# Patient Record
Sex: Male | Born: 1990 | Race: Black or African American | Hispanic: No | Marital: Single | Smoking: Never smoker
Health system: Southern US, Community
[De-identification: ages and names within clinical notes are randomized; demographics above are authoritative.]

---

## 2015-10-03 ENCOUNTER — Encounter (HOSPITAL_COMMUNITY): Payer: Self-pay | Admitting: Emergency Medicine

## 2015-10-03 ENCOUNTER — Emergency Department (HOSPITAL_COMMUNITY)
Admission: EM | Admit: 2015-10-03 | Discharge: 2015-10-03 | Disposition: A | Discharge status: Home / Self Care | Payer: PRIVATE HEALTH INSURANCE | Attending: Emergency Medicine | Admitting: Emergency Medicine

## 2015-10-03 ENCOUNTER — Emergency Department (HOSPITAL_COMMUNITY): Payer: PRIVATE HEALTH INSURANCE

## 2015-10-03 DIAGNOSIS — Y9289 Other specified places as the place of occurrence of the external cause: Secondary | ICD-10-CM | POA: Insufficient documentation

## 2015-10-03 DIAGNOSIS — S8992XA Unspecified injury of left lower leg, initial encounter: Secondary | ICD-10-CM | POA: Diagnosis present

## 2015-10-03 DIAGNOSIS — S8002XA Contusion of left knee, initial encounter: Secondary | ICD-10-CM | POA: Diagnosis not present

## 2015-10-03 DIAGNOSIS — Y990 Civilian activity done for income or pay: Secondary | ICD-10-CM | POA: Diagnosis not present

## 2015-10-03 DIAGNOSIS — Y9389 Activity, other specified: Secondary | ICD-10-CM | POA: Insufficient documentation

## 2015-10-03 DIAGNOSIS — M25562 Pain in left knee: Secondary | ICD-10-CM
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
## 2015-10-03 NOTE — Discharge Instructions (Signed)
Use tylenol or motrin as needed for pain. Ice and elevate your knee for additional relief of pain. Follow up with your regular doctor in 1 week for recheck of ongoing symptoms. Return to the ER for changes or worsening symptoms.   Knee Pain Knee pain is a very common symptom and can have many causes. Knee pain often goes away when you follow your health care provider's instructions for relieving pain and discomfort at home. However, knee pain can develop into a condition that needs treatment. Some conditions may include:  Arthritis caused by wear and tear (osteoarthritis).  Arthritis caused by swelling and irritation (rheumatoid arthritis or gout).  A cyst or growth in your knee.  An infection in your knee joint.  An injury that will not heal.  Damage, swelling, or irritation of the tissues that support your knee (torn ligaments or tendinitis). If your knee pain continues, additional tests may be ordered to diagnose your condition. Tests may include X-rays or other imaging studies of your knee. You may also need to have fluid removed from your knee. Treatment for ongoing knee pain depends on the cause, but treatment may include:  Medicines to relieve pain or swelling.  Steroid injections in your knee.  Physical therapy.  Surgery. HOME CARE INSTRUCTIONS  Take medicines only as directed by your health care provider.  Rest your knee and keep it raised (elevated) while you are resting.  Do not do things that cause or worsen pain.  Avoid high-impact activities or exercises, such as running, jumping rope, or doing jumping jacks.  Apply ice to the knee area:  Put ice in a plastic bag.  Place a towel between your skin and the bag.  Leave the ice on for 20 minutes, 2-3 times a day.  Ask your health care provider if you should wear an elastic knee support.  Keep a pillow under your knee when you sleep.  Lose weight if you are overweight. Extra weight can put pressure on your  knee.  Do not use any tobacco products, including cigarettes, chewing tobacco, or electronic cigarettes. If you need help quitting, ask your health care provider. Smoking may slow the healing of any bone and joint problems that you may have. SEEK MEDICAL CARE IF:  Your knee pain continues, changes, or gets worse.  You have a fever along with knee pain.  Your knee buckles or locks up.  Your knee becomes more swollen. SEEK IMMEDIATE MEDICAL CARE IF:   Your knee joint feels hot to the touch.  You have chest pain or trouble breathing.   This information is not intended to replace advice given to you by your health care provider. Make sure you discuss any questions you have with your health care provider.   Document Released: 07/23/2007 Document Revised: 10/16/2014 Document Reviewed: 05/11/2014 Elsevier Interactive Patient Education 2016 [employer]  Contusion A contusion is a deep bruise. Contusions happen when an injury causes bleeding under the skin. Symptoms of bruising include pain, swelling, and discolored skin. The skin may turn blue, purple, or yellow. HOME CARE   Rest the injured area.  If told, put ice on the injured area.  Put ice in a plastic bag.  Place a towel between your skin and the bag.  Leave the ice on for 20 minutes, 2-3 times per day.  If told, put light pressure (compression) on the injured area using an elastic bandage. Make sure the bandage is not too tight. Remove it and put it back  on as told by your doctor.  If possible, raise (elevate) the injured area above the level of your heart while you are sitting or lying down.  Take over-the-counter and prescription medicines only as told by your doctor. GET HELP IF:  Your symptoms do not get better after several days of treatment.  Your symptoms get worse.  You have trouble moving the injured area. GET HELP RIGHT AWAY IF:   You have very bad pain.  You have a loss of feeling (numbness) in a hand  or foot.  Your hand or foot turns pale or cold.   This information is not intended to replace advice given to you by your health care provider. Make sure you discuss any questions you have with your health care provider.   Document Released: 03/13/2008 Document Revised: 06/16/2015 Document Reviewed: 02/10/2015 Elsevier Interactive Patient Education 2016 [employer]  Cryotherapy Cryotherapy is when you put ice on your injury. Ice helps lessen pain and puffiness (swelling) after an injury. Ice works the best when you start using it in the first 24 to 48 hours after an injury. HOME CARE  Put a dry or damp towel between the ice pack and your skin.  You may press gently on the ice pack.  Leave the ice on for no more than 10 to 20 minutes at a time.  Check your skin after 5 minutes to make sure your skin is okay.  Rest at least 20 minutes between ice pack uses.  Stop using ice when your skin loses feeling (numbness).  Do not use ice on someone who cannot tell you when it hurts. This includes small children and people with memory problems (dementia). GET HELP RIGHT AWAY IF:  You have white spots on your skin.  Your skin turns blue or pale.  Your skin feels waxy or hard.  Your puffiness gets worse. MAKE SURE YOU:   Understand these instructions.  Will watch your condition.  Will get help right away if you are not doing well or get worse.   This information is not intended to replace advice given to you by your health care provider. Make sure you discuss any questions you have with your health care provider.   Document Released: 03/13/2008 Document Revised: 12/18/2011 Document Reviewed: 05/18/2011 Elsevier Interactive Patient Education [employer]
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
## 2017-07-05 IMAGING — CR DG KNEE COMPLETE 4+V*L*
4 series · 4 of 4 positions shown · non-contrast
Comparison: None.

CLINICAL DATA: 24-year-old male with fall and left knee pain.

EXAM:
LEFT KNEE - COMPLETE 4+ VIEW

[t knee ap left]
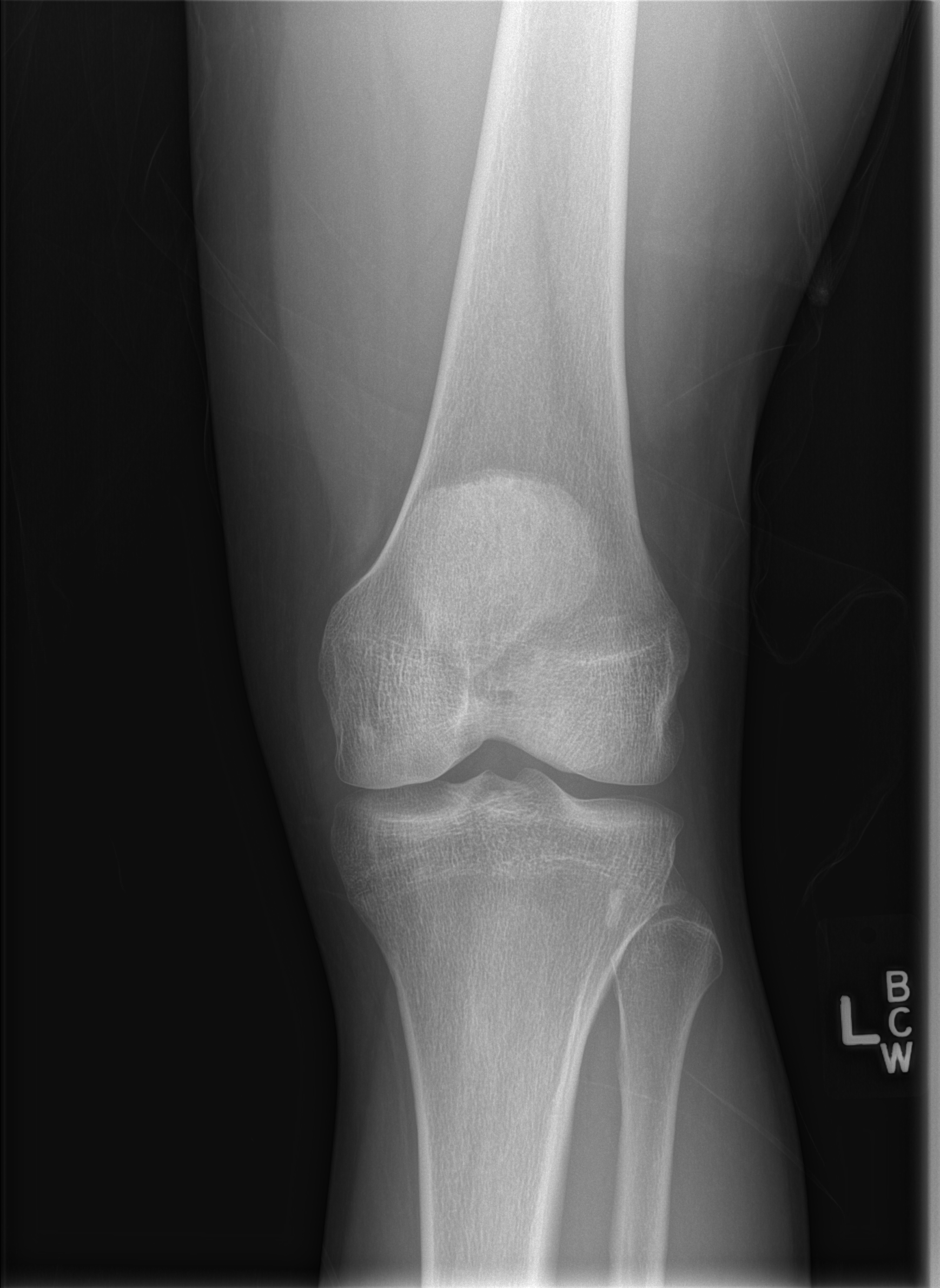

[t knee obl left (1 of 2)]
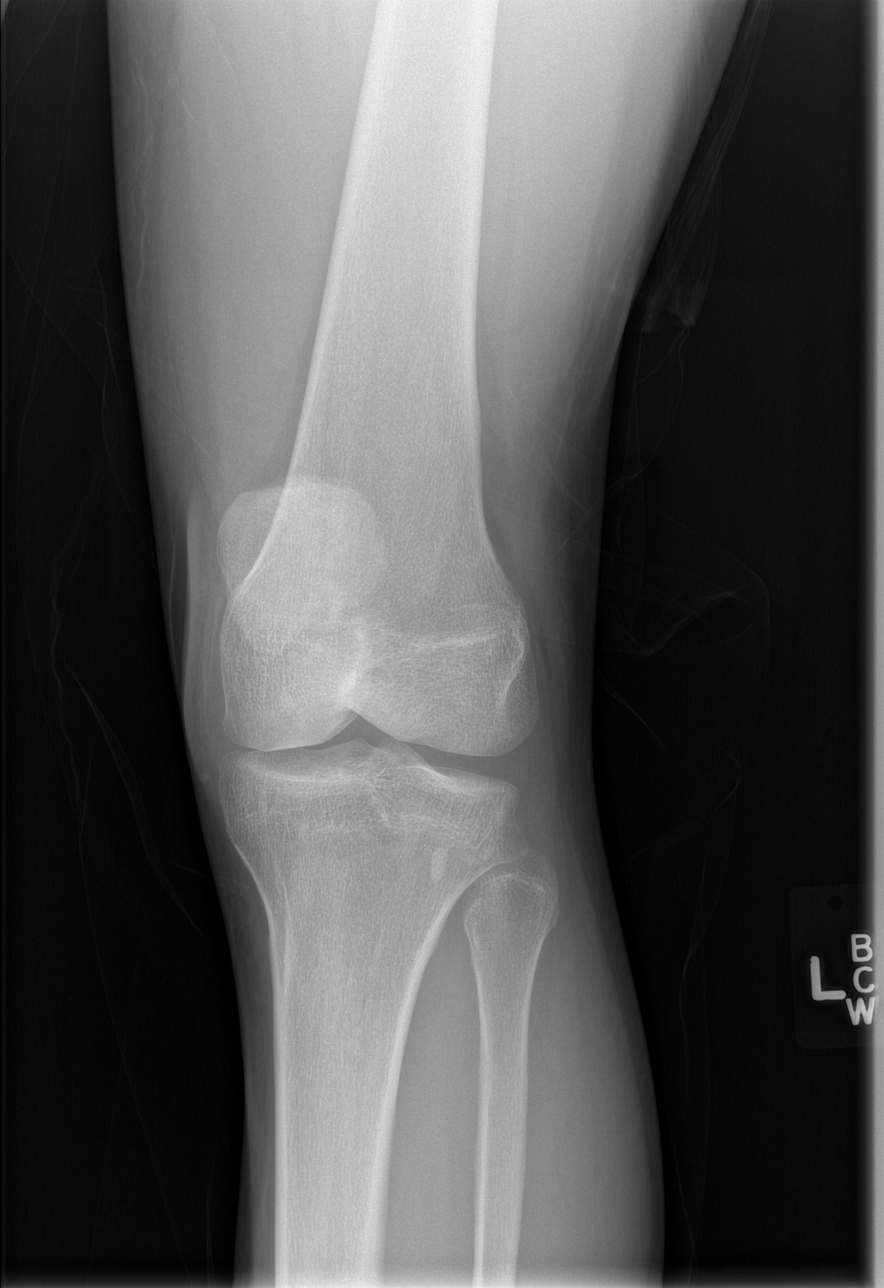

[t knee obl left (2 of 2)]
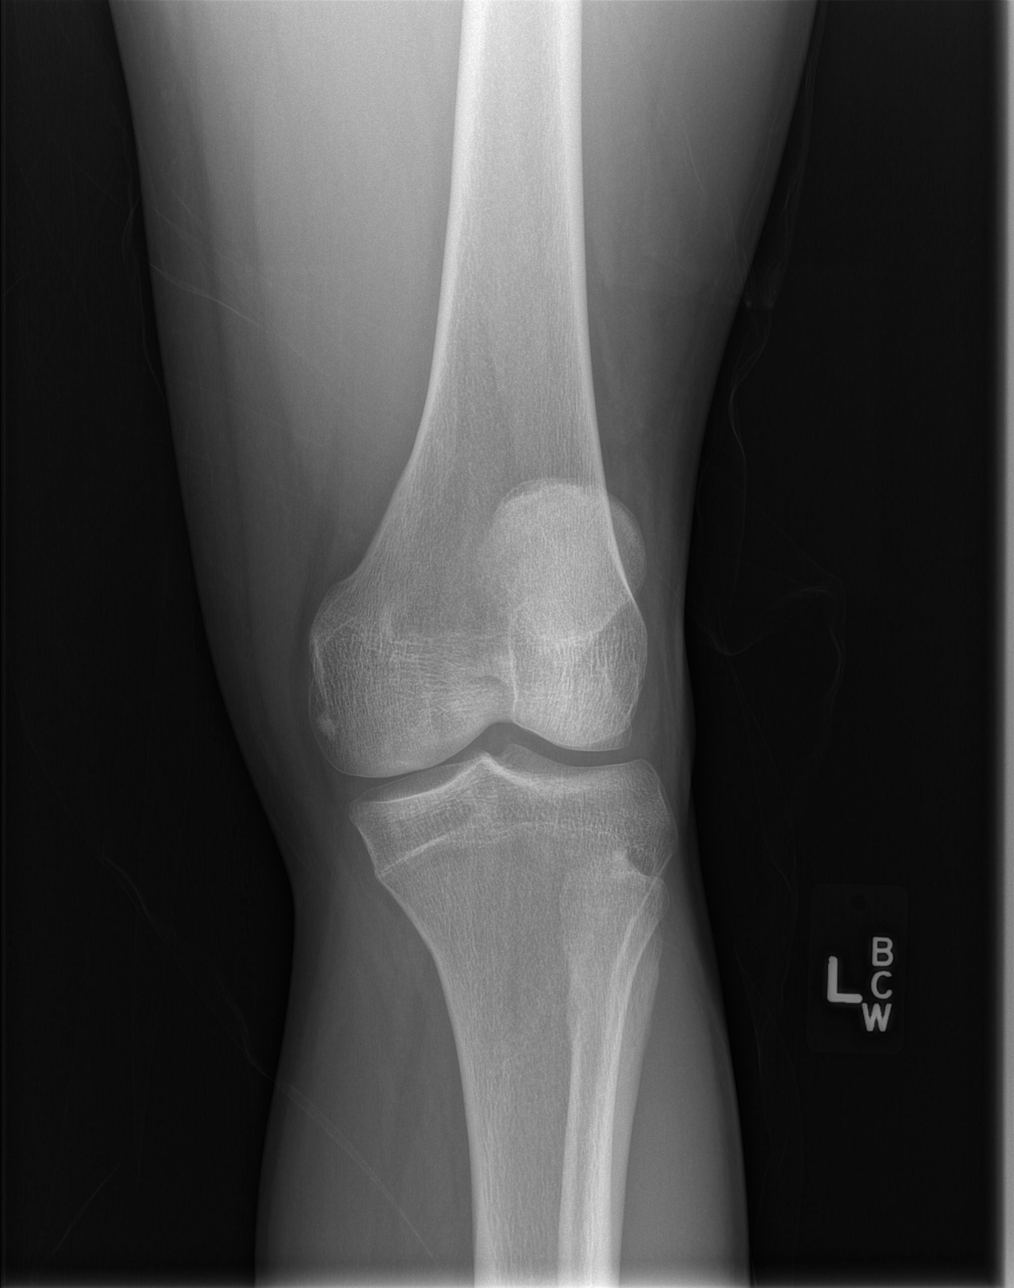

[t knee lat left]
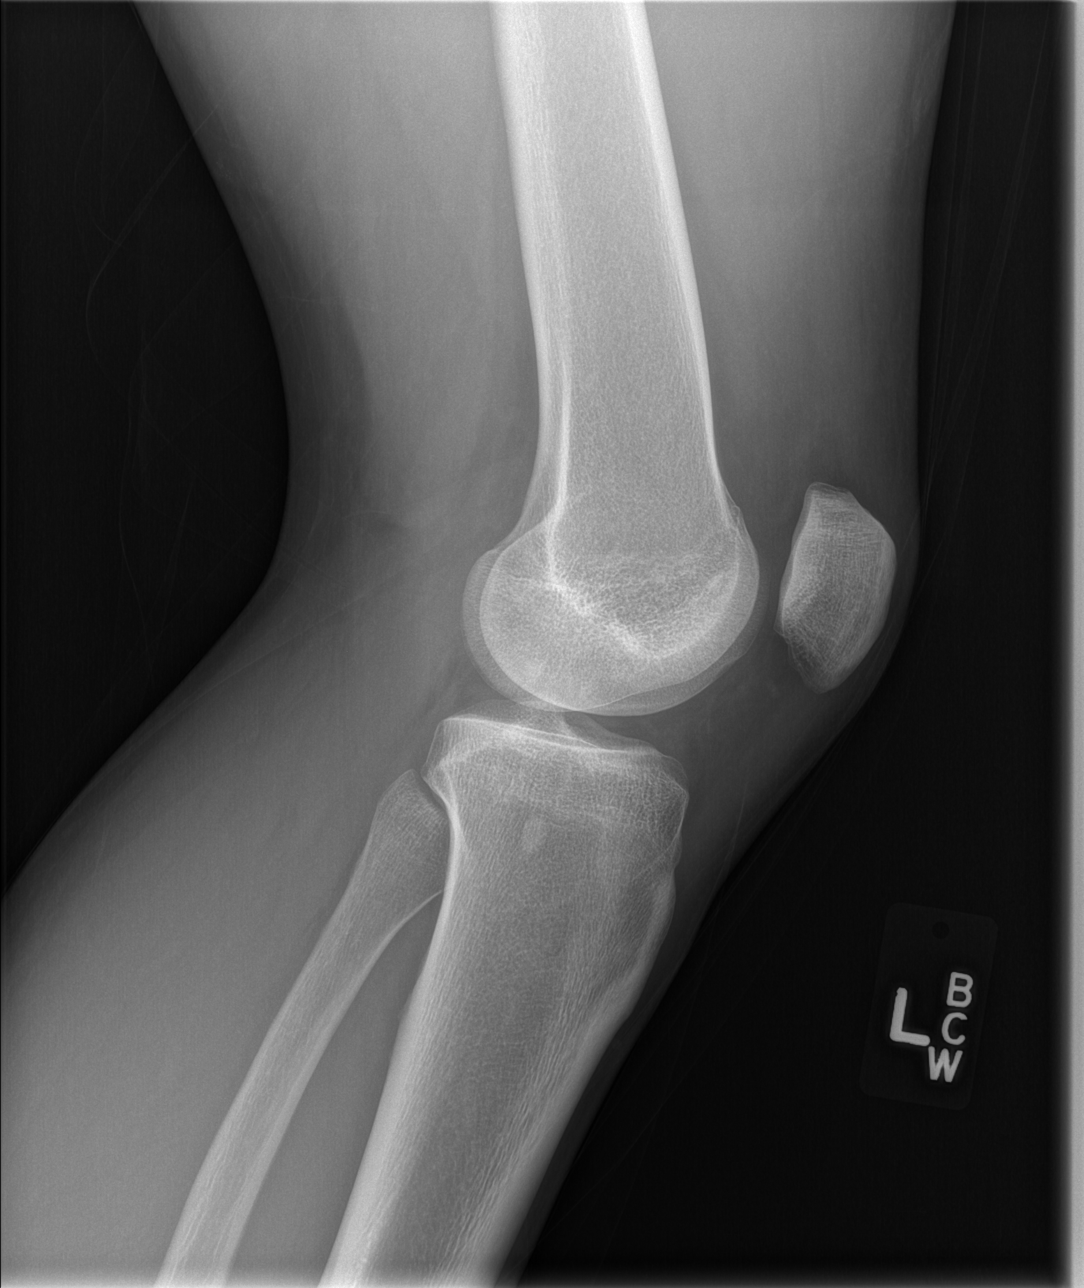

[4 of 4 positions shown; findings below may reference images not displayed]

FINDINGS: There is no evidence of fracture, dislocation, or joint effusion.
There is no evidence of arthropathy or other focal bone abnormality.
Soft tissues are unremarkable.
IMPRESSION: Negative.
